# Patient Record
Sex: Male | Born: 1965 | Race: White | Hispanic: No | Marital: Married | Smoking: Never smoker
Health system: Southern US, Community
[De-identification: ages and names within clinical notes are randomized; demographics above are authoritative.]

## PROBLEM LIST (undated history)

## (undated) HISTORY — PX: SEPTOPLASTY: SUR1290

---
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
## 2010-06-24 ENCOUNTER — Ambulatory Visit: Payer: Self-pay | Admitting: Internal Medicine

## 2012-07-18 IMAGING — CR DG LUMBAR SPINE COMPLETE 4+V
1 series · 6 of 6 positions shown · non-contrast
Comparison: none

REASON FOR EXAM: mass/lipoma
COMMENTS:

PROCEDURE:     MDR - M[REDACTED] SPINE WITH OBLIQUES  - June 24, 2010 [DATE]
RESULT:     Images of the lumbar spine demonstrate the disc spaces and
vertebral body heights are maintained. There is no evidence of fracture or
bony destruction.

[Series 1: view not recorded · 0.17mm/px · 6 of 6 slices shown]
[im 1/6]
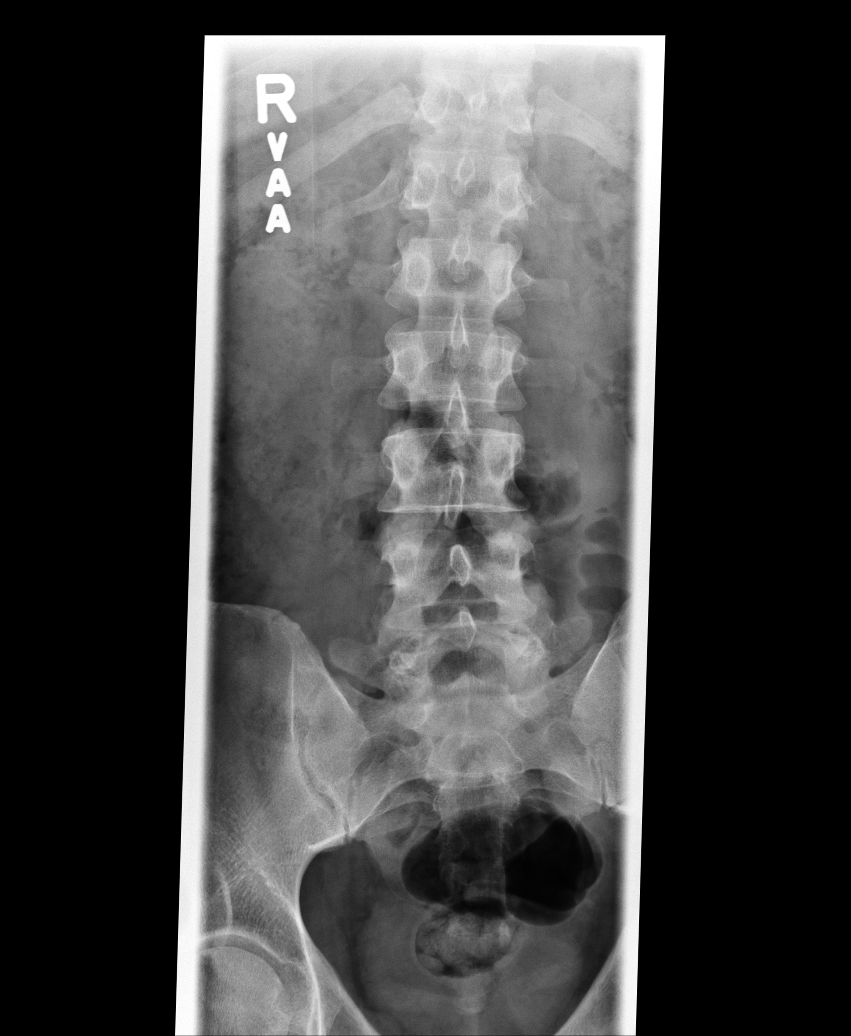
[im 2/6]
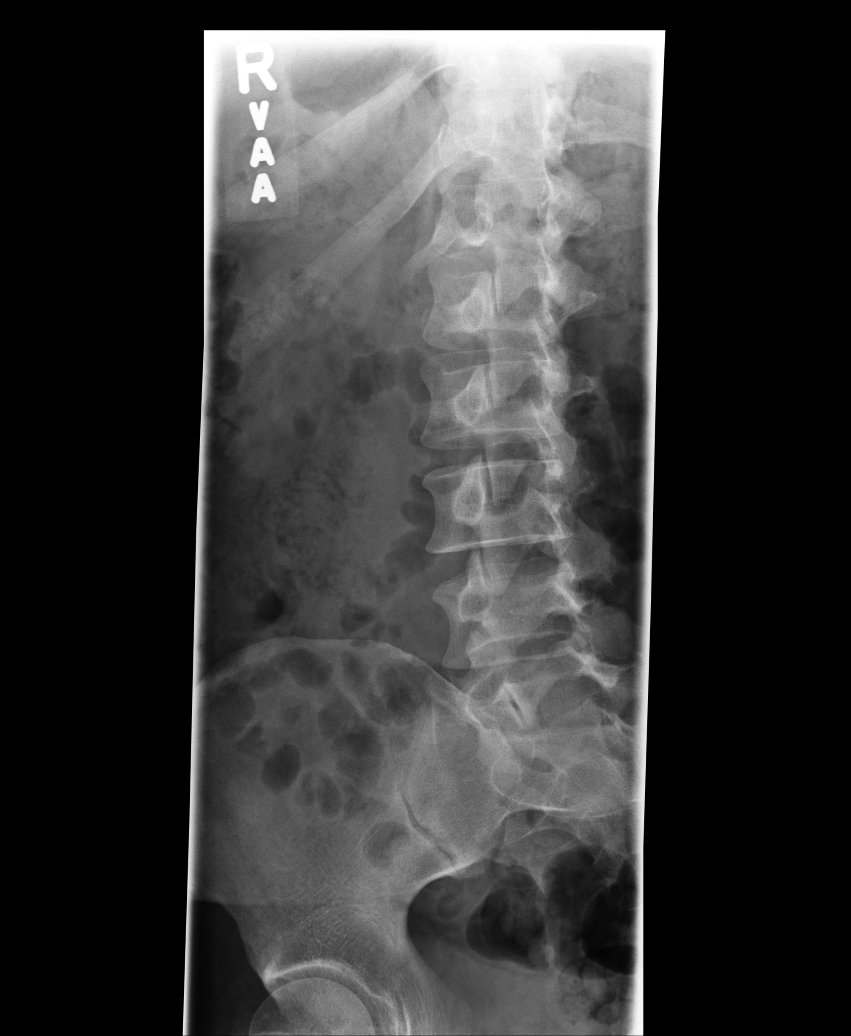
[im 3/6]
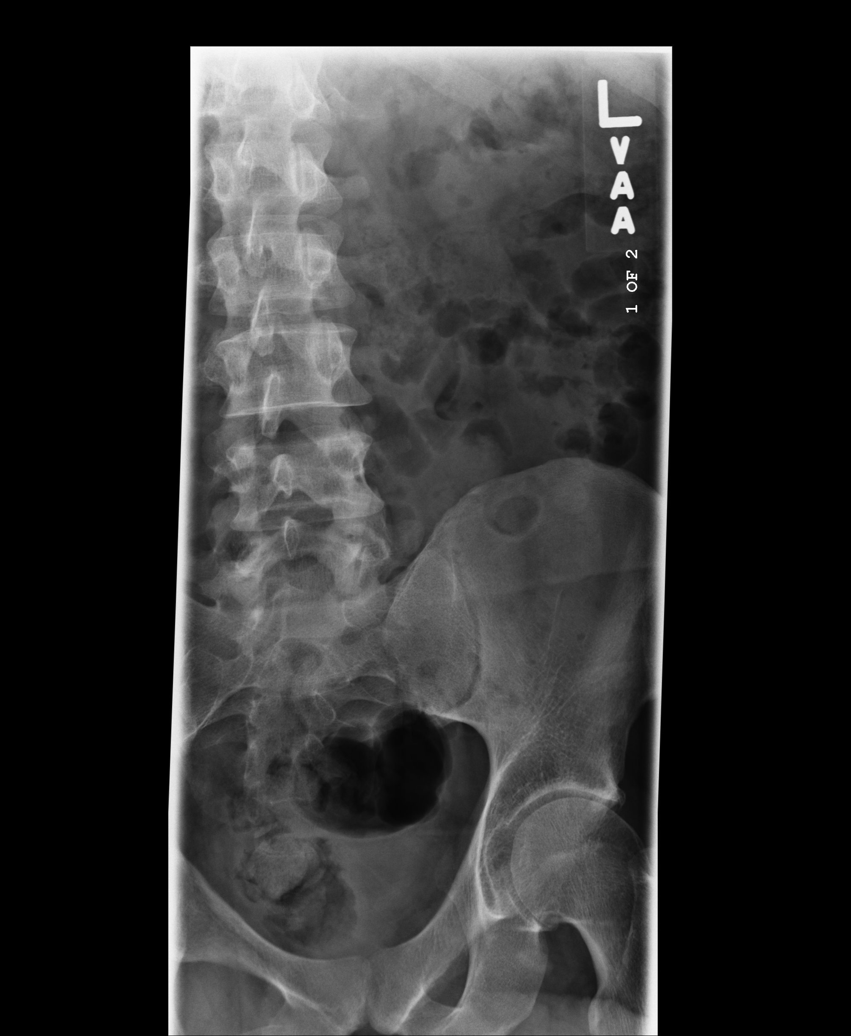
[im 4/6]
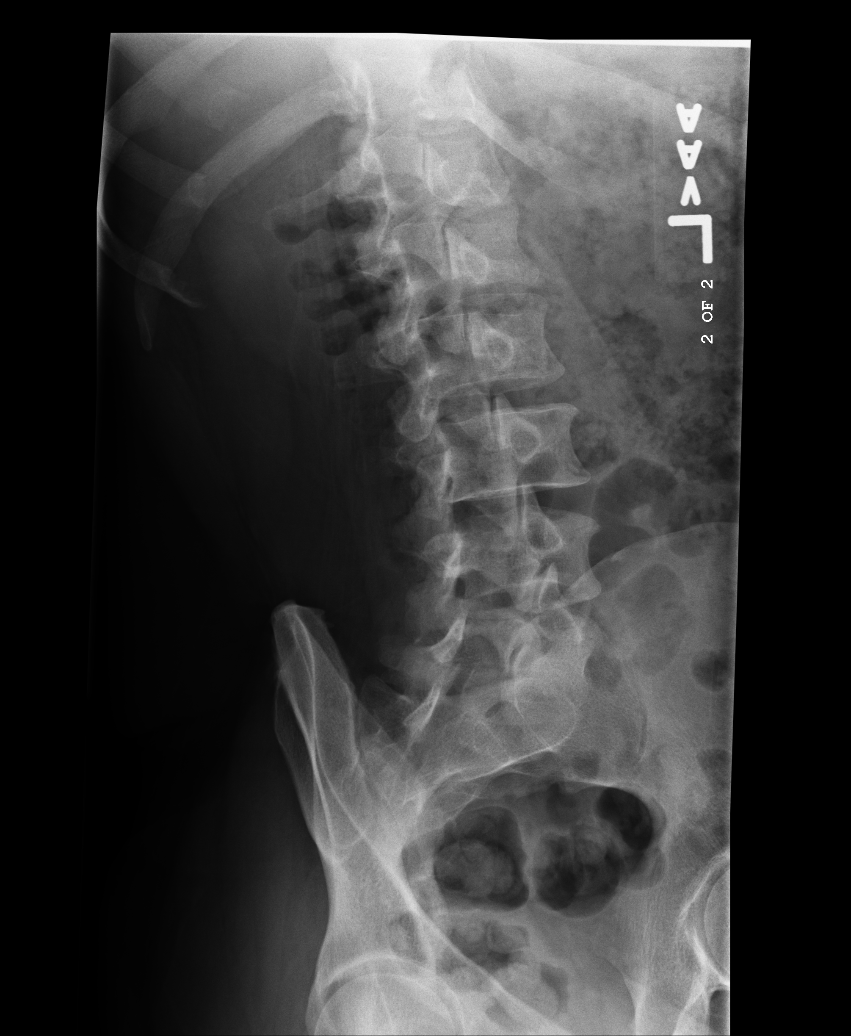
[im 5/6]
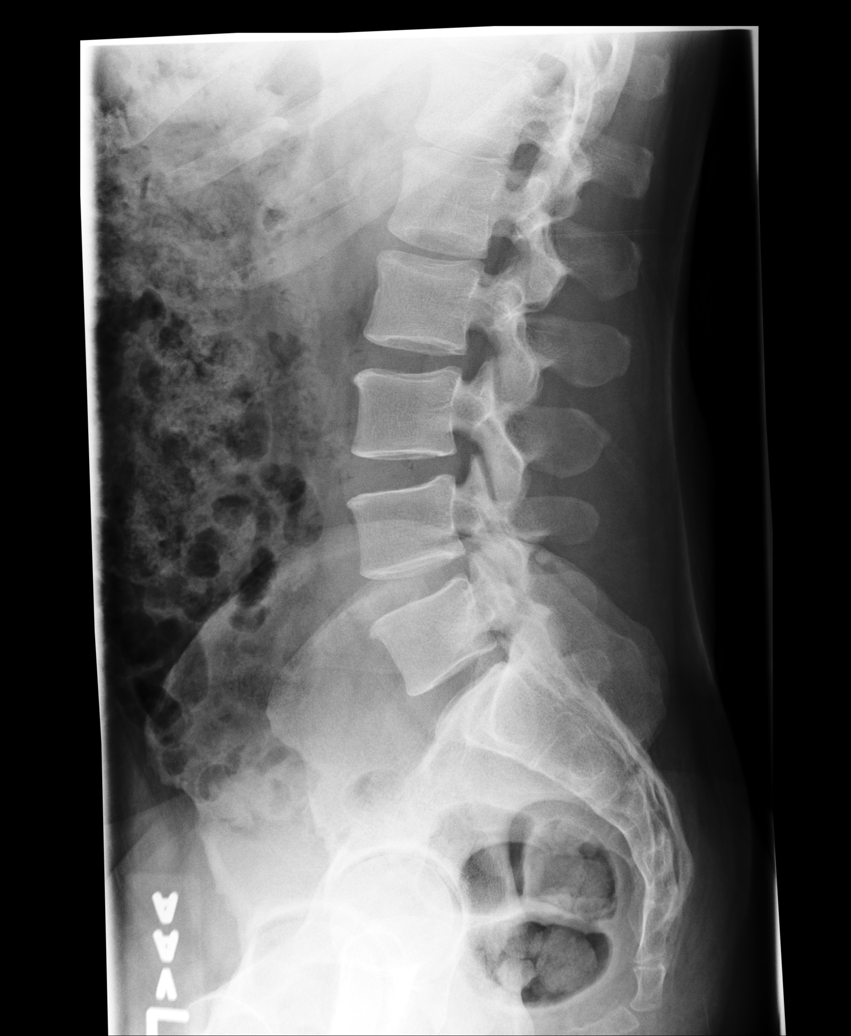
[im 6/6]
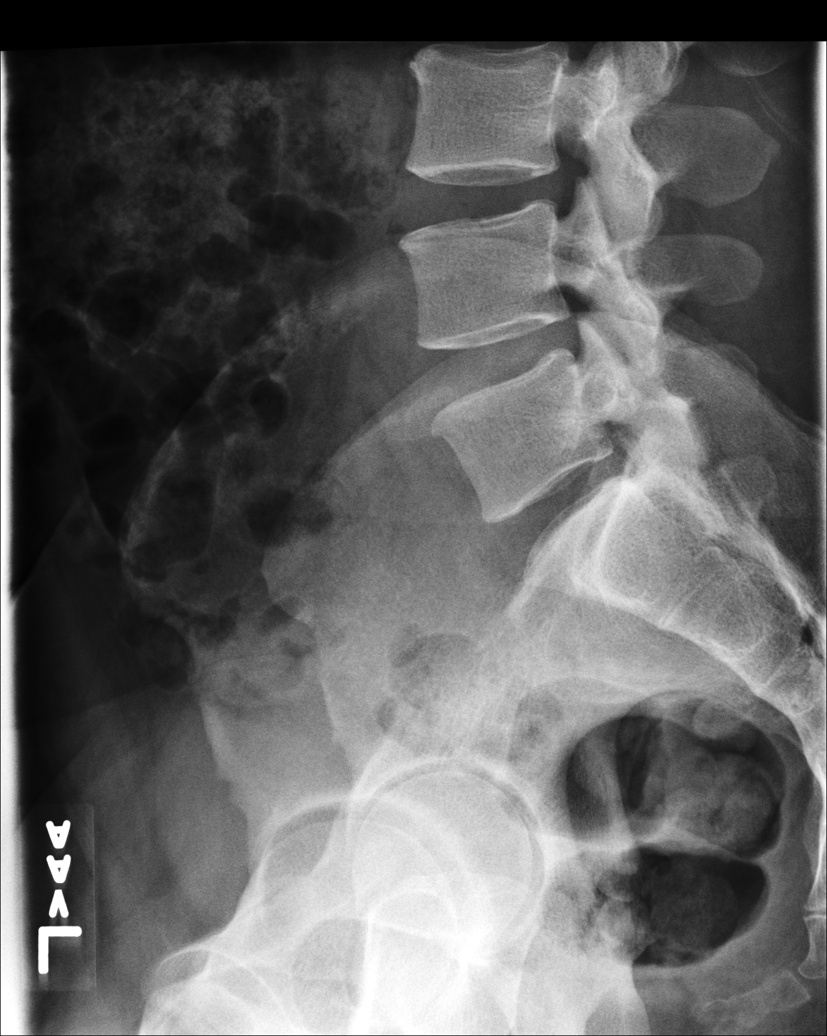

[6 of 6 positions shown; findings below may reference images not displayed]

IMPRESSION: No acute bony abnormality evident in the lumbar spine.

## 2014-06-05 LAB — CBC AND DIFFERENTIAL
HEMATOCRIT: 54 % — AB (ref 41–53)
Hemoglobin: 18.3 g/dL — AB (ref 13.5–17.5)

## 2014-06-05 LAB — LIPID PANEL
CHOLESTEROL: 267 mg/dL — AB (ref 0–200)
HDL: 92 mg/dL — AB (ref 35–70)
LDL Cholesterol: 117 mg/dL
Triglycerides: 291 mg/dL — AB (ref 40–160)

## 2014-06-05 LAB — BASIC METABOLIC PANEL
BUN: 16 mg/dL (ref 4–21)
Creatinine: 1 mg/dL (ref 0.6–1.3)

## 2014-06-05 LAB — PSA: PSA: 0.9

## 2014-06-05 LAB — TSH: TSH: 1.02 u[IU]/mL (ref 0.41–5.90)

## 2015-05-15 ENCOUNTER — Encounter: Payer: Self-pay | Admitting: Internal Medicine

## 2015-05-15 ENCOUNTER — Other Ambulatory Visit: Payer: Self-pay | Admitting: Internal Medicine

## 2015-05-15 DIAGNOSIS — M7530 Calcific tendinitis of unspecified shoulder: Secondary | ICD-10-CM

## 2015-05-15 DIAGNOSIS — G4700 Insomnia, unspecified: Secondary | ICD-10-CM | POA: Insufficient documentation

## 2015-05-15 DIAGNOSIS — I10 Essential (primary) hypertension: Secondary | ICD-10-CM | POA: Insufficient documentation

## 2015-05-15 DIAGNOSIS — E291 Testicular hypofunction: Secondary | ICD-10-CM | POA: Insufficient documentation

## 2015-05-15 DIAGNOSIS — M51379 Other intervertebral disc degeneration, lumbosacral region without mention of lumbar back pain or lower extremity pain: Secondary | ICD-10-CM | POA: Insufficient documentation

## 2015-05-15 DIAGNOSIS — M5137 Other intervertebral disc degeneration, lumbosacral region: Secondary | ICD-10-CM | POA: Insufficient documentation

## 2015-05-15 DIAGNOSIS — M7500 Adhesive capsulitis of unspecified shoulder: Secondary | ICD-10-CM | POA: Insufficient documentation

## 2015-05-15 DIAGNOSIS — D235 Other benign neoplasm of skin of trunk: Secondary | ICD-10-CM | POA: Insufficient documentation

## 2015-05-28 ENCOUNTER — Other Ambulatory Visit: Payer: Self-pay | Admitting: Internal Medicine

## 2015-05-30 NOTE — Telephone Encounter (Signed)
pts coming in on1/5/17

## 2015-06-21 ENCOUNTER — Ambulatory Visit: Payer: Self-pay | Admitting: Internal Medicine

## 2015-07-01 ENCOUNTER — Other Ambulatory Visit: Payer: Self-pay | Admitting: Internal Medicine

## 2015-07-09 NOTE — Telephone Encounter (Signed)
Unable to lm on pts vm, due to no vm setup.

## 2015-08-02 ENCOUNTER — Other Ambulatory Visit: Payer: Self-pay | Admitting: Internal Medicine

## 2015-08-09 ENCOUNTER — Encounter: Payer: Self-pay | Admitting: Internal Medicine

## 2015-08-09 ENCOUNTER — Ambulatory Visit (INDEPENDENT_AMBULATORY_CARE_PROVIDER_SITE_OTHER): Payer: BLUE CROSS/BLUE SHIELD | Admitting: Internal Medicine

## 2015-08-09 VITALS — BP 122/80 | HR 72 | Ht 68.0 in | Wt 190.4 lb

## 2015-08-09 DIAGNOSIS — J309 Allergic rhinitis, unspecified: Secondary | ICD-10-CM

## 2015-08-09 DIAGNOSIS — I10 Essential (primary) hypertension: Secondary | ICD-10-CM

## 2015-08-09 DIAGNOSIS — R42 Dizziness and giddiness: Secondary | ICD-10-CM | POA: Diagnosis not present

## 2015-08-09 MED ORDER — FEXOFENADINE HCL 180 MG PO TABS: 180.0000 mg | ORAL_TABLET | Freq: Every day | ORAL | Status: DC

## 2015-08-09 MED ORDER — AMLODIPINE BESYLATE 10 MG PO TABS: 10.0000 mg | ORAL_TABLET | Freq: Every day | ORAL | Status: DC

## 2015-08-09 MED ORDER — FLUTICASONE PROPIONATE 50 MCG/ACT NA SUSP: 2.0000 | Freq: Every day | NASAL | Status: DC

## 2015-08-09 NOTE — Progress Notes (Signed)
Date:  08/09/2015   Name:  Mason Knapp   DOB:  Aug 24, 1965   MRN:  [id number]   Chief Complaint: Follow-up and Hypertension Hypertension This is a chronic problem. The current episode started more than 1 year ago. The problem is unchanged. The problem is controlled. Pertinent negatives include no chest pain, headaches, palpitations or shortness of breath.   Allergic rhinitis - has a history of allergies but they improved for a number of years.  Recently symptoms has re-surfaced without any new exposures.  He has swollen red eyes without crusting every AM.  He has nasal congestion and runny nose.  He also has dizziness when he turns his head quickly.  He denies discolored mucus or chest complaints.   Review of Systems  Constitutional: Negative for fever, chills, diaphoresis and fatigue.  HENT: Positive for congestion, rhinorrhea, sinus pressure and sneezing. Negative for hearing loss, trouble swallowing and voice change.   Eyes: Positive for visual disturbance.  Respiratory: Negative for cough, chest tightness and shortness of breath.   Cardiovascular: Negative for chest pain, palpitations and leg swelling.  Gastrointestinal: Negative for abdominal pain.  Endocrine: Negative for polydipsia and polyuria.  Musculoskeletal: Negative for gait problem.  Neurological: Positive for dizziness. Negative for light-headedness, numbness and headaches.  Hematological: Negative for adenopathy. Does not bruise/bleed easily.    Patient Active Problem List   Diagnosis Date Noted  . 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 Insomnia, persistent 05/15/2015  . 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 Degeneration of intervertebral disc of lumbosacral region 05/15/2015  . Essential (primary) hypertension 05/15/2015  . Benign neoplasm of skin of trunk 05/15/2015    Prior to Admission medications   Medication Sig Start Date End Date Taking? Authorizing Provider  amLODipine (NORVASC) 10 MG tablet TAKE 1 TABLET  BY MOUTH EVERY DAY 07/01/15  Yes Glean Hess, MD    No Known Allergies  Past Surgical History  Procedure Laterality Date  . Collateral ligament repair, elbow  1984  . Septoplasty      Social History  Substance Use Topics  . Smoking status: Never Smoker   . Smokeless tobacco: Current User    Types: Snuff  . Alcohol Use: No     Medication list has been reviewed and updated.   Physical Exam  Constitutional: He is oriented to person, place, and time. He appears well-developed. No distress.  HENT:  Head: Normocephalic and atraumatic.  Right Ear: Tympanic membrane and ear canal normal.  Left Ear: Tympanic membrane and ear canal normal.  Nose: Nose normal. Right sinus exhibits no maxillary sinus tenderness and no frontal sinus tenderness. Left sinus exhibits no maxillary sinus tenderness and no frontal sinus tenderness.  Mouth/Throat: Oropharynx is clear and moist.  Eyes: Pupils are equal, round, and reactive to light. Right conjunctiva is injected. Left conjunctiva is injected. Right eye exhibits no nystagmus. Left eye exhibits no nystagmus.  Cardiovascular: Normal rate, regular rhythm and normal heart sounds.   Pulmonary/Chest: Effort normal and breath sounds normal. No respiratory distress.  Musculoskeletal: Normal range of motion.  Neurological: He is alert and oriented to person, place, and time.  Skin: Skin is warm and dry. No rash noted.  Psychiatric: He has a normal mood and affect. His speech is normal and behavior is normal. Thought content normal.    BP 122/80 mmHg  Pulse 72  Ht 5\' 8"  (1.727 m)  Wt 190 lb 6.4 oz (86.365 kg)  BMI 28.96 kg/m2  Assessment and Plan: 1. Essential (  primary) hypertension controlled - amLODipine (NORVASC) 10 MG tablet; Take 1 tablet (10 mg total) by mouth daily.  Dispense: 30 tablet; Refill: 5  2. Allergic rhinitis, unspecified allergic rhinitis type Begin treatment - fluticasone (FLONASE) 50 MCG/ACT nasal spray; Place 2 sprays  into both nostrils daily.  Dispense: 16 g; Refill: 6 - fexofenadine (ALLEGRA) 180 MG tablet; Take 1 tablet (180 mg total) by mouth daily.  Dispense: 30 tablet; Refill: 5  3. Vertigo Consult ENT    Halina Maidens, MD [facility]  08/09/2015

## 2016-02-02 ENCOUNTER — Other Ambulatory Visit: Payer: Self-pay | Admitting: Internal Medicine

## 2016-02-02 DIAGNOSIS — I10 Essential (primary) hypertension: Secondary | ICD-10-CM

## 2016-07-11 ENCOUNTER — Other Ambulatory Visit: Payer: Self-pay | Admitting: Internal Medicine

## 2016-07-11 DIAGNOSIS — I10 Essential (primary) hypertension: Secondary | ICD-10-CM

## 2016-07-21 NOTE — Telephone Encounter (Signed)
Unable to reach pt, keep getting a busy tone

## 2016-09-16 ENCOUNTER — Other Ambulatory Visit: Payer: Self-pay | Admitting: Internal Medicine

## 2016-09-16 DIAGNOSIS — I10 Essential (primary) hypertension: Secondary | ICD-10-CM

## 2016-09-22 ENCOUNTER — Telehealth: Payer: Self-pay

## 2016-09-22 ENCOUNTER — Other Ambulatory Visit: Payer: Self-pay | Admitting: Internal Medicine

## 2016-09-22 DIAGNOSIS — I10 Essential (primary) hypertension: Secondary | ICD-10-CM

## 2016-09-22 MED ORDER — AMLODIPINE BESYLATE 10 MG PO TABS: 10.0000 mg | ORAL_TABLET | Freq: Every day | ORAL | 0 refills | Status: DC

## 2016-09-22 NOTE — Telephone Encounter (Signed)
Wants refills for BP meds but can not get in here until 4/23 and is scheduled for that day. Can we send enough meds to last till then please? He has nothing right now. Wife left message on nurse VM. Thanks please ONLY call if you can not send in med refill.   [phone]

## 2016-09-22 NOTE — Telephone Encounter (Signed)
Sending to Dr. Army Melia for decision of medication refill...--

## 2016-09-26 ENCOUNTER — Ambulatory Visit: Payer: BLUE CROSS/BLUE SHIELD | Admitting: Internal Medicine

## 2016-10-06 ENCOUNTER — Encounter: Payer: Self-pay | Admitting: Internal Medicine

## 2016-10-06 ENCOUNTER — Ambulatory Visit (INDEPENDENT_AMBULATORY_CARE_PROVIDER_SITE_OTHER): Payer: BLUE CROSS/BLUE SHIELD | Admitting: Internal Medicine

## 2016-10-06 VITALS — BP 126/80 | HR 82 | Ht 68.0 in | Wt 187.3 lb

## 2016-10-06 DIAGNOSIS — I10 Essential (primary) hypertension: Secondary | ICD-10-CM | POA: Diagnosis not present

## 2016-10-06 DIAGNOSIS — E291 Testicular hypofunction: Secondary | ICD-10-CM | POA: Diagnosis not present

## 2016-10-06 DIAGNOSIS — J3089 Other allergic rhinitis: Secondary | ICD-10-CM | POA: Diagnosis not present

## 2016-10-06 MED ORDER — AMLODIPINE BESYLATE 10 MG PO TABS: 10.0000 mg | ORAL_TABLET | Freq: Every day | ORAL | 5 refills | Status: DC

## 2016-10-06 NOTE — Patient Instructions (Signed)
Flonase over the counter - use every day

## 2016-10-06 NOTE — Progress Notes (Signed)
Date:  10/06/2016   Name:  Mason Knapp   DOB:  09-07-1965   MRN:  [id number]   Chief Complaint: Hypertension Hypertension  This is a chronic problem. The problem is unchanged. The problem is controlled. Pertinent negatives include no chest pain, headaches, palpitations or shortness of breath. Past treatments include calcium channel blockers. The current treatment provides significant improvement.   Allergies - has watery itchy eyes and sneezing.  He has tried over the counter medications without benefit.  He has not tried [facility].  Low Testosterone - no longer taking testosterone injections (his source passed away).  It has been 6 months since he had any and he feels fine.  No change in mood, energy, or libido. He questions whether he should resume therapy.  Review of Systems  Constitutional: Negative for chills, fatigue and fever.  HENT: Negative for hearing loss, tinnitus, trouble swallowing and voice change.   Eyes: Positive for discharge, itching and visual disturbance (close vision has changed).  Respiratory: Negative for cough, chest tightness, shortness of breath and wheezing.   Cardiovascular: Negative for chest pain, palpitations and leg swelling.  Gastrointestinal: Negative for abdominal pain.  Neurological: Negative for dizziness, tremors, weakness, light-headedness and headaches.  Hematological: Negative for adenopathy.    Patient Active Problem List   Diagnosis Date Noted  . 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 Insomnia, persistent 05/15/2015  . 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 Degeneration of intervertebral disc of lumbosacral region 05/15/2015  . Essential (primary) hypertension 05/15/2015  . Benign neoplasm of skin of trunk 05/15/2015    Prior to Admission medications   Medication Sig Start Date End Date Taking? Authorizing Provider  amLODipine (NORVASC) 10 MG tablet Take 1 tablet (10 mg total) by mouth daily. 09/22/16  Yes Glean Hess, MD    No Known  Allergies  Past Surgical History:  Procedure Laterality Date  . COLLATERAL LIGAMENT REPAIR, ELBOW  1984  . SEPTOPLASTY      Social History  Substance Use Topics  . Smoking status: Never Smoker  . Smokeless tobacco: Former Systems developer    Types: Snuff    Quit date: 10/06/2008  . Alcohol use No     Medication list has been reviewed and updated.   Physical Exam  Constitutional: He is oriented to person, place, and time. He appears well-developed. No distress.  HENT:  Head: Normocephalic and atraumatic.  Right Ear: Tympanic membrane and ear canal normal.  Left Ear: Tympanic membrane and ear canal normal.  Nose: Right sinus exhibits maxillary sinus tenderness. Left sinus exhibits maxillary sinus tenderness.  Mouth/Throat: No posterior oropharyngeal edema or posterior oropharyngeal erythema.  Eyes: Conjunctivae and EOM are normal. Right eye exhibits no chemosis and no exudate. Left eye exhibits no chemosis and no exudate.  Neck: Normal range of motion. Neck supple. Carotid bruit is not present.  Cardiovascular: Normal rate, regular rhythm and normal heart sounds.   Pulmonary/Chest: Effort normal and breath sounds normal. No respiratory distress. He has no wheezes.  Musculoskeletal: Normal range of motion.  Neurological: He is alert and oriented to person, place, and time.  Skin: Skin is warm and dry. No rash noted.  Psychiatric: He has a normal mood and affect. His speech is normal and behavior is normal. Thought content normal.  Nursing note and vitals reviewed.   BP 126/80 (BP Location: Right Arm, Patient Position: Sitting, Cuff Size: Large)   Pulse 82   Ht 5\' 8"  (1.727 m)   Wt 187 lb 4.8 oz (  85 kg)   SpO2 97%   BMI 28.48 kg/m   Assessment and Plan: 1. Essential (primary) hypertension controlled - amLODipine (NORVASC) 10 MG tablet; Take 1 tablet (10 mg total) by mouth daily.  Dispense: 30 tablet; Refill: 5 - CBC with Differential/Platelet - Comprehensive metabolic panel  2.  Environmental and seasonal allergies Flonase NS  3. Eunuchoidism Recommend no further evaluation or treatment at this time   Meds ordered this encounter  Medications  . amLODipine (NORVASC) 10 MG tablet    Sig: Take 1 tablet (10 mg total) by mouth daily.    Dispense:  30 tablet    Refill:  Rosine, MD [facility]  10/06/2016

## 2017-04-28 ENCOUNTER — Other Ambulatory Visit: Payer: Self-pay | Admitting: Internal Medicine

## 2017-04-28 DIAGNOSIS — I10 Essential (primary) hypertension: Secondary | ICD-10-CM

## 2017-04-28 NOTE — Telephone Encounter (Signed)
Could not reach patient on any number provided-two of the numbers are ringing busy, the one for spouse is invalid.

## 2017-05-05 NOTE — Telephone Encounter (Signed)
Could not leave message phone rang busy

## 2017-05-27 ENCOUNTER — Other Ambulatory Visit: Payer: Self-pay | Admitting: Internal Medicine

## 2017-05-27 DIAGNOSIS — I10 Essential (primary) hypertension: Secondary | ICD-10-CM

## 2017-06-26 ENCOUNTER — Other Ambulatory Visit: Payer: Self-pay | Admitting: Internal Medicine

## 2017-06-26 DIAGNOSIS — I10 Essential (primary) hypertension: Secondary | ICD-10-CM

## 2017-07-14 NOTE — Telephone Encounter (Signed)
Could not lvm phone ringing busy.

## 2017-07-28 ENCOUNTER — Other Ambulatory Visit: Payer: Self-pay | Admitting: Internal Medicine

## 2017-07-28 DIAGNOSIS — I10 Essential (primary) hypertension: Secondary | ICD-10-CM

## 2017-08-26 ENCOUNTER — Other Ambulatory Visit: Payer: Self-pay | Admitting: Internal Medicine

## 2017-08-26 DIAGNOSIS — I10 Essential (primary) hypertension: Secondary | ICD-10-CM

## 2017-09-09 ENCOUNTER — Other Ambulatory Visit: Payer: Self-pay

## 2017-09-09 ENCOUNTER — Telehealth: Payer: Self-pay | Admitting: Internal Medicine

## 2017-09-09 DIAGNOSIS — I10 Essential (primary) hypertension: Secondary | ICD-10-CM

## 2017-09-09 MED ORDER — AMLODIPINE BESYLATE 10 MG PO TABS: ORAL_TABLET | ORAL | 0 refills | Status: DC

## 2017-09-09 NOTE — Telephone Encounter (Signed)
Sent patient 15 days worth of medication to [facility]. Thank you.

## 2017-09-09 NOTE — Telephone Encounter (Signed)
Patient requesting medfill to [facility] for Norvasc just til April 3 OV.

## 2017-09-16 ENCOUNTER — Encounter: Payer: Self-pay | Admitting: Internal Medicine

## 2017-09-16 ENCOUNTER — Ambulatory Visit (INDEPENDENT_AMBULATORY_CARE_PROVIDER_SITE_OTHER): Payer: BLUE CROSS/BLUE SHIELD | Admitting: Internal Medicine

## 2017-09-16 VITALS — BP 132/86 | HR 73 | Ht 68.0 in | Wt 191.0 lb

## 2017-09-16 DIAGNOSIS — I10 Essential (primary) hypertension: Secondary | ICD-10-CM | POA: Diagnosis not present

## 2017-09-16 DIAGNOSIS — J3089 Other allergic rhinitis: Secondary | ICD-10-CM

## 2017-09-16 DIAGNOSIS — K219 Gastro-esophageal reflux disease without esophagitis: Secondary | ICD-10-CM | POA: Insufficient documentation

## 2017-09-16 MED ORDER — DEXLANSOPRAZOLE 60 MG PO CPDR: 60.0000 mg | DELAYED_RELEASE_CAPSULE | Freq: Every day | ORAL | 2 refills | Status: DC

## 2017-09-16 MED ORDER — FEXOFENADINE HCL 180 MG PO TABS
180.0000 mg | ORAL_TABLET | Freq: Every day | ORAL | 5 refills | Status: AC
Start: 2017-09-16 — End: ?

## 2017-09-16 MED ORDER — AMLODIPINE BESYLATE 10 MG PO TABS: ORAL_TABLET | ORAL | 5 refills | Status: DC

## 2017-09-16 NOTE — Progress Notes (Signed)
Date:  09/16/2017   Name:  Mason Knapp   DOB:  01-22-1966   MRN:  [id number]   Chief Complaint: Hypertension and Gastroesophageal Reflux (Acid reflux and spitting up food in mouth. X 2 months. ) Hypertension  This is a chronic problem. The problem is controlled. Pertinent negatives include no chest pain, headaches, palpitations or shortness of breath. Past treatments include calcium channel blockers. The current treatment provides significant improvement.  Gastroesophageal Reflux  He complains of abdominal pain (gerd), choking, globus sensation, heartburn and water brash. He reports no chest pain, no coughing, no hoarse voice, no nausea or no wheezing. This is a new problem. The current episode started more than 1 month ago. The problem occurs occasionally. The problem has been gradually improving (since limiting spicy foods and taking prilosec). The heartburn duration is several minutes. The heartburn is located in the substernum. The heartburn is of moderate intensity. The heartburn wakes him from sleep. The heartburn does not limit his activity. The heartburn doesn't change with position. The symptoms are aggravated by certain foods. Pertinent negatives include no fatigue. He has tried a PPI and a histamine-2 antagonist for the symptoms. The treatment provided mild relief.  No blood work has been done since 2015.  He was last seen one year ago.  On amlodipine at that time.  Had stopped testosterone injections and doing well.  Allergic sx -  Itchy eyes and runny nose constantly.  No hives noted.  Review of Systems  Constitutional: Negative for chills, fatigue and fever.  HENT: Positive for rhinorrhea, sneezing and trouble swallowing (and intermittent dysphagia). Negative for hoarse voice.   Eyes: Positive for itching. Negative for visual disturbance.  Respiratory: Positive for choking. Negative for cough, chest tightness, shortness of breath and wheezing.   Cardiovascular: Negative for  chest pain and palpitations.  Gastrointestinal: Positive for abdominal pain (gerd) and heartburn. Negative for blood in stool, constipation, diarrhea and nausea.  Genitourinary: Negative for dysuria.  Allergic/Immunologic: Positive for environmental allergies.  Neurological: Negative for dizziness and headaches.  Hematological: Negative for adenopathy.  Psychiatric/Behavioral: Negative for sleep disturbance.    Patient Active Problem List   Diagnosis Date Noted  . Gastroesophageal reflux disease 09/16/2017  . Environmental and seasonal allergies 10/06/2016  . 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 Insomnia, persistent 05/15/2015  . 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 Degeneration of intervertebral disc of lumbosacral region 05/15/2015  . Essential (primary) hypertension 05/15/2015  . Benign neoplasm of skin of trunk 05/15/2015    Prior to Admission medications   Medication Sig Start Date End Date Taking? Authorizing Provider  amLODipine (NORVASC) 10 MG tablet TAKE 1 TABLET BY MOUTH EVERY DAY. 09/09/17   Glean Hess, MD    No Known Allergies  Past Surgical History:  Procedure Laterality Date  . COLLATERAL LIGAMENT REPAIR, ELBOW  1984  . SEPTOPLASTY      Social History   Tobacco Use  . Smoking status: Never Smoker  . Smokeless tobacco: Former Systems developer    Types: Snuff  Substance Use Topics  . Alcohol use: No    Alcohol/week: 0.0 oz  . Drug use: No     Medication list has been reviewed and updated.  PHQ 2/9 Scores 09/16/2017  PHQ - 2 Score 0  PHQ- 9 Score 0    Physical Exam  Constitutional: He is oriented to person, place, and time. He appears well-developed. No distress.  HENT:  Head: Normocephalic and atraumatic.  Neck: Normal range of motion. Neck  supple. No thyromegaly present.  Cardiovascular: Normal rate, regular rhythm and normal heart sounds.  Pulmonary/Chest: Effort normal and breath sounds normal. No respiratory distress. He has no wheezes.  Abdominal: Soft.  Bowel sounds are normal. He exhibits no mass. There is no tenderness. There is no rebound.  Musculoskeletal: Normal range of motion. He exhibits no edema or tenderness.  Neurological: He is alert and oriented to person, place, and time.  Skin: Skin is warm and dry. No rash noted.  Psychiatric: He has a normal mood and affect. His behavior is normal. Thought content normal.  Nursing note and vitals reviewed.   BP 132/86   Pulse 73   Ht 5\' 8"  (1.727 m)   Wt 191 lb (86.6 kg)   SpO2 98%   BMI 29.04 kg/m   Assessment and Plan: 1. Essential (primary) hypertension Controlled, continue current medication - CBC with Differential/Platelet - Comprehensive metabolic panel - TSH - amLODipine (NORVASC) 10 MG tablet; TAKE 1 TABLET BY MOUTH EVERY DAY.  Dispense: 30 tablet; Refill: 5  2. Gastroesophageal reflux disease, esophagitis presence not specified Change PPI to Dexilant Would like to get Ba Swallow but pt wants to make diet and med changes first rec elevated HOB 15 deg - CBC with Differential/Platelet - dexlansoprazole (DEXILANT) 60 MG capsule; Take 1 capsule (60 mg total) by mouth daily.  Dispense: 30 capsule; Refill: 2 - H. pylori antibody, IgG  3. Environmental and seasonal allergies Allegra 180 mg daily PRN - fexofenadine (ALLEGRA ALLERGY) 180 MG tablet; Take 1 tablet (180 mg total) by mouth daily.  Dispense: 30 tablet; Refill: 5   Meds ordered this encounter  Medications  . amLODipine (NORVASC) 10 MG tablet    Sig: TAKE 1 TABLET BY MOUTH EVERY DAY.    Dispense:  30 tablet    Refill:  5    Need appt  . dexlansoprazole (DEXILANT) 60 MG capsule    Sig: Take 1 capsule (60 mg total) by mouth daily.    Dispense:  30 capsule    Refill:  2  . fexofenadine (ALLEGRA ALLERGY) 180 MG tablet    Sig: Take 1 tablet (180 mg total) by mouth daily.    Dispense:  30 tablet    Refill:  5    Partially dictated using Editor, commissioning. Any errors are unintentional.  Halina Maidens,  MD [facility]  09/16/2017

## 2017-09-16 NOTE — Patient Instructions (Signed)
Allergies - Allegra 180 mg once a day or Claritin 10 mg once a day

## 2017-09-17 LAB — COMPREHENSIVE METABOLIC PANEL
A/G RATIO: 1.9 (ref 1.2–2.2)
ALK PHOS: 101 IU/L (ref 39–117)
ALT: 42 IU/L (ref 0–44)
AST: 32 IU/L (ref 0–40)
Albumin: 4.9 g/dL (ref 3.5–5.5)
BUN/Creatinine Ratio: 16 (ref 9–20)
BUN: 15 mg/dL (ref 6–24)
Bilirubin Total: 0.6 mg/dL (ref 0.0–1.2)
CO2: 23 mmol/L (ref 20–29)
CREATININE: 0.93 mg/dL (ref 0.76–1.27)
Calcium: 10 mg/dL (ref 8.7–10.2)
Chloride: 98 mmol/L (ref 96–106)
GFR calc Af Amer: 111 mL/min/{1.73_m2} (ref >59)
GFR calc non Af Amer: 96 mL/min/{1.73_m2} (ref >59)
GLOBULIN, TOTAL: 2.6 g/dL (ref 1.5–4.5)
Glucose: 93 mg/dL (ref 65–99)
POTASSIUM: 4 mmol/L (ref 3.5–5.2)
SODIUM: 138 mmol/L (ref 134–144)
Total Protein: 7.5 g/dL (ref 6.0–8.5)

## 2017-09-17 LAB — TSH: TSH: 0.768 u[IU]/mL (ref 0.450–4.500)

## 2017-09-17 LAB — H. PYLORI ANTIBODY, IGG: H. pylori, IgG AbS: <0.8 Index Value (ref 0.00–0.79)

## 2017-09-17 LAB — CBC WITH DIFFERENTIAL/PLATELET
Basophils Absolute: 0 10*3/uL (ref 0.0–0.2)
Basos: 1 %
EOS (ABSOLUTE): 0.2 10*3/uL (ref 0.0–0.4)
EOS: 5 %
HEMATOCRIT: 48.1 % (ref 37.5–51.0)
Hemoglobin: 16.5 g/dL (ref 13.0–17.7)
Immature Grans (Abs): 0 10*3/uL (ref 0.0–0.1)
Immature Granulocytes: 1 %
LYMPHS ABS: 1 10*3/uL (ref 0.7–3.1)
Lymphs: 27 %
MCH: 32.9 pg (ref 26.6–33.0)
MCHC: 34.3 g/dL (ref 31.5–35.7)
MCV: 96 fL (ref 79–97)
Monocytes Absolute: 0.4 10*3/uL (ref 0.1–0.9)
Monocytes: 11 %
NEUTROS PCT: 55 %
Neutrophils Absolute: 2.1 10*3/uL (ref 1.4–7.0)
PLATELETS: 247 10*3/uL (ref 150–379)
RBC: 5.02 x10E6/uL (ref 4.14–5.80)
RDW: 13.6 % (ref 12.3–15.4)
WBC: 3.7 10*3/uL (ref 3.4–10.8)

## 2018-02-10 ENCOUNTER — Ambulatory Visit: Payer: BLUE CROSS/BLUE SHIELD | Admitting: Internal Medicine

## 2018-03-27 ENCOUNTER — Other Ambulatory Visit: Payer: Self-pay | Admitting: Internal Medicine

## 2018-03-27 DIAGNOSIS — I10 Essential (primary) hypertension: Secondary | ICD-10-CM

## 2018-04-05 ENCOUNTER — Other Ambulatory Visit: Payer: Self-pay | Admitting: Internal Medicine

## 2018-04-05 DIAGNOSIS — I10 Essential (primary) hypertension: Secondary | ICD-10-CM

## 2018-05-18 ENCOUNTER — Encounter: Payer: BLUE CROSS/BLUE SHIELD | Admitting: Internal Medicine

## 2018-08-03 ENCOUNTER — Other Ambulatory Visit: Payer: Self-pay | Admitting: Internal Medicine

## 2018-08-03 DIAGNOSIS — I10 Essential (primary) hypertension: Secondary | ICD-10-CM

## 2018-11-17 ENCOUNTER — Encounter: Payer: BLUE CROSS/BLUE SHIELD | Admitting: Internal Medicine

## 2019-02-06 ENCOUNTER — Other Ambulatory Visit: Payer: Self-pay | Admitting: Internal Medicine

## 2019-02-06 DIAGNOSIS — I10 Essential (primary) hypertension: Secondary | ICD-10-CM

## 2019-02-07 NOTE — Telephone Encounter (Signed)
Patient mentioned that hes not sure when he can come in for med refills, I did mention in order for him to continue getting refills he must come in for an appointment. He said hes not taking the pills and said that he will call back to schedule appointment.

## 2019-05-19 ENCOUNTER — Other Ambulatory Visit: Payer: Self-pay | Admitting: Internal Medicine

## 2019-05-19 DIAGNOSIS — I10 Essential (primary) hypertension: Secondary | ICD-10-CM

## 2019-05-20 NOTE — Telephone Encounter (Signed)
Wife called to ask if we can have his meds transferred to [facility], I mention to her she would have to call them to transfer the meds. She also asked if it would be ok if Mason Knapp was too miss a day or two of is meds I told her that we could not give that advise to her, and that he should not miss any dose which could cause him to have a stroke or any kind of reaction to not taking his meds. I advise him to go to urgent care for a few days of medicine  if he is completley out.

## 2019-05-20 NOTE — Telephone Encounter (Signed)
Please call the patient and inform him he needs an appt to be seen in the next 90 days before we can continue to RF his meds. Thank you.

## 2019-09-01 ENCOUNTER — Encounter: Payer: BLUE CROSS/BLUE SHIELD | Admitting: Internal Medicine

## 2023-10-14 ENCOUNTER — Encounter: Payer: Self-pay | Admitting: Student

## 2023-10-14 ENCOUNTER — Ambulatory Visit (INDEPENDENT_AMBULATORY_CARE_PROVIDER_SITE_OTHER): Payer: Self-pay | Admitting: Student

## 2023-10-14 VITALS — BP 124/80 | HR 78 | Ht 68.0 in | Wt 155.8 lb

## 2023-10-14 DIAGNOSIS — I10 Essential (primary) hypertension: Secondary | ICD-10-CM

## 2023-10-14 DIAGNOSIS — M25562 Pain in left knee: Secondary | ICD-10-CM

## 2023-10-14 DIAGNOSIS — G2581 Restless legs syndrome: Secondary | ICD-10-CM | POA: Insufficient documentation

## 2023-10-14 DIAGNOSIS — G8929 Other chronic pain: Secondary | ICD-10-CM | POA: Insufficient documentation

## 2023-10-14 DIAGNOSIS — E785 Hyperlipidemia, unspecified: Secondary | ICD-10-CM | POA: Insufficient documentation

## 2023-10-14 DIAGNOSIS — E782 Mixed hyperlipidemia: Secondary | ICD-10-CM
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
## 2023-10-14 NOTE — Assessment & Plan Note (Signed)
 Elevated cholesterol and triglyceride in the past. Lipid panel today.

## 2023-10-14 NOTE — Assessment & Plan Note (Signed)
 Previously on amlodipine , has not been on this in years. Has lost weight exercises regularly now. BP is well controlled. Continue to monitor.
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
## 2023-10-15 ENCOUNTER — Encounter: Payer: Self-pay | Admitting: Family Medicine

## 2023-10-15 ENCOUNTER — Ambulatory Visit (INDEPENDENT_AMBULATORY_CARE_PROVIDER_SITE_OTHER): Payer: Self-pay | Admitting: Family Medicine

## 2023-10-15 VITALS — BP 124/76 | HR 66 | Ht 68.0 in | Wt 157.0 lb

## 2023-10-15 DIAGNOSIS — M25562 Pain in left knee: Secondary | ICD-10-CM

## 2023-10-15 DIAGNOSIS — G8929 Other chronic pain: Secondary | ICD-10-CM

## 2023-10-15 LAB — BASIC METABOLIC PANEL WITH GFR
BUN/Creatinine Ratio: 22 — ABNORMAL HIGH (ref 9–20)
BUN: 24 mg/dL (ref 6–24)
CO2: 22 mmol/L (ref 20–29)
Calcium: 9.5 mg/dL (ref 8.7–10.2)
Chloride: 104 mmol/L (ref 96–106)
Creatinine, Ser: 1.08 mg/dL (ref 0.76–1.27)
Glucose: 78 mg/dL (ref 70–99)
Potassium: 4.5 mmol/L (ref 3.5–5.2)
Sodium: 142 mmol/L (ref 134–144)
eGFR: 81 mL/min/{1.73_m2} (ref >59)

## 2023-10-15 LAB — CBC WITH DIFFERENTIAL/PLATELET
Basophils Absolute: 0 10*3/uL (ref 0.0–0.2)
Basos: 1 %
EOS (ABSOLUTE): 0.1 10*3/uL (ref 0.0–0.4)
Eos: 1 %
Hematocrit: 52.9 % — ABNORMAL HIGH (ref 37.5–51.0)
Hemoglobin: 16.6 g/dL (ref 13.0–17.7)
Immature Grans (Abs): 0 10*3/uL (ref 0.0–0.1)
Immature Granulocytes: 0 %
Lymphocytes Absolute: 1.1 10*3/uL (ref 0.7–3.1)
Lymphs: 21 %
MCH: 30.1 pg (ref 26.6–33.0)
MCHC: 31.4 g/dL — ABNORMAL LOW (ref 31.5–35.7)
MCV: 96 fL (ref 79–97)
Monocytes Absolute: 0.4 10*3/uL (ref 0.1–0.9)
Monocytes: 7 %
Neutrophils Absolute: 3.5 10*3/uL (ref 1.4–7.0)
Neutrophils: 70 %
Platelets: 235 10*3/uL (ref 150–450)
RBC: 5.52 x10E6/uL (ref 4.14–5.80)
RDW: 13 % (ref 11.6–15.4)
WBC: 5.1 10*3/uL (ref 3.4–10.8)

## 2023-10-15 LAB — LIPID PANEL
Chol/HDL Ratio: 3.9 ratio (ref 0.0–5.0)
Cholesterol, Total: 185 mg/dL (ref 100–199)
HDL: 47 mg/dL (ref >39)
LDL Chol Calc (NIH): 126 mg/dL — ABNORMAL HIGH (ref 0–99)
Triglycerides: 61 mg/dL (ref 0–149)
VLDL Cholesterol Cal: 12 mg/dL (ref 5–40)

## 2023-10-15 MED ORDER — CELECOXIB 100 MG PO CAPS: 100.0000 mg | ORAL_CAPSULE | Freq: Two times a day (BID) | ORAL | 0 refills | Status: DC | PRN

## 2023-10-15 NOTE — Progress Notes (Signed)
 Primary Care / Sports Medicine Office Visit  Patient Information:  Patient ID: Mason Knapp, male DOB: [birth year] Age: 58 y.o. MRN: [id number]   Mason Knapp is a pleasant 59 y.o. male presenting with the following:  Chief Complaint  Patient presents with   Knee Pain    Chronic Left knee pain. The pain is worse at night and in the morning. He takes [facility] powder daily for pain. He has tried creams and cortisone injections but they do not help anymore.     Vitals:   10/15/23 1048  BP: 124/76  Pulse: 66  SpO2: 97%   Vitals:   10/15/23 1048  Weight: 157 lb (71.2 kg)  Height: 5\' 8"  (1.727 m)   Body mass index is 23.87 kg/m.  No results found.   Independent interpretation of notes and tests performed by another provider:   None  Procedures performed:   Procedure:  Injection of left knee.  Anteromedial approach. Verbal informed consent obtained and verified. Skin prepped in a sterile fashion. Ethyl chloride for topical local analgesia.  Completed without difficulty and tolerated well. Medication: triamcinolone acetonide 40 mg/mL suspension for injection 1 mL total and 2 mL lidocaine 1% without epinephrine utilized for needle placement anesthetic Advised to contact for fevers/chills, erythema, induration, drainage, or persistent bleeding.   Pertinent History, Exam, Impression, and Recommendations:   Problem List Items Addressed This Visit     Chronic pain of left knee - Primary   History of Present Illness Mason Knapp is a 58 year old male who presents with chronic left knee pain.  He experiences chronic left knee pain, which has worsened over the past year or two. The pain is most severe at rest, particularly at night and in the early morning, and is described as a 'heartbeat' sensation. Despite the pain, he remains physically active, walking five miles and performing 500 sit-ups and push-ups daily.  He has a history of knee injury and has previously  received cortisone injections, which initially provided relief for six months to a year, but the last injection lasted only about a month. He has not had an injection in several years due to cost and lack of insurance. He has tried ibuprofen 800 mg and topical creams without relief and takes five to ten BC powders a day for pain relief.  He seeks to manage his pain without narcotics or frequent injections. He is in the process of obtaining insurance after retiring. He experiences no pain during physical activity but significant pain at rest, particularly at night and in the morning.  Physical Exam MEASUREMENTS: Weight- 157. INSPECTION: Good quadriceps tone. PALPATION: No effusion in the left knee. Mild tenderness at the lateral patella facet. Nontender patellar and quadriceps tendons, nontender lateral medial joint lines. RANGE OF MOTION: Painless terminal flexion, ROM 0-135 degrees. No crepitus with passive ROM. SPECIAL TESTS: Negative anterior/posterior drawer, valgus/varus stress, McMurray's tests. Single leg squat elicits increased valgus motion on the left.  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 Previous treatments ineffective. Cortisone injection considered for immediate relief. - Administer cortisone injection to the left knee. - Prescribe oral anti-inflammatory medication. - Provide exercises to improve knee alignment. - Advise ice application for 20 minutes twice daily post-injection. - Instruct to avoid strenuous activity for two days post-injection.  Osteoarthritis of the knee Suspected osteoarthritis in the patellofemoral compartment, exacerbated by activity and weight changes. Gel injections considered  for long-term relief once insured. - Consider gel injections once insurance is obtained. - Advise weight management and continued physical activity.      Relevant Medications   celecoxib   (CELEBREX ) 100 MG capsule     Orders & Medications Medications:  Meds ordered this encounter  Medications   celecoxib  (CELEBREX ) 100 MG capsule    Sig: Take 1 capsule (100 mg total) by mouth 2 (two) times daily as needed.    Dispense:  60 capsule    Refill:  0   No orders of the defined types were placed in this encounter.    Return if symptoms worsen or fail to improve.     Ma Saupe, MD, [facility]   Primary Care Sports Medicine Primary Care and Sports Medicine at [facility]

## 2023-10-15 NOTE — Patient Instructions (Signed)
 Patient Plan  Knee Pain Due to Patellofemoral Maltracking: 1. Receive a cortisone injection in the left knee today. 2. Start taking prescribed oral anti-inflammatory medication (Celebrex  twice a day with food), continue until knee symptoms respond to cortisone. 3. Perform exercises provided to improve knee alignment once knee symptoms resolve. 4. Apply ice to the knee for 20 minutes, twice daily, after the injection. 5. Avoid strenuous activities for two days following the injection.  Osteoarthritis of the Knee: 1. Consider gel injections for long-term relief once insurance coverage is confirmed. 2. Focus on weight management and maintain regular physical activity.  Red Flags: - Contact your healthcare provider if you notice increased swelling, redness, or persistent pain in the knee.

## 2023-10-15 NOTE — Progress Notes (Signed)
 Spoke with patient, discussed lab results and instructions by provider. Patient verbalized understanding.
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
## 2023-10-22 ENCOUNTER — Telehealth: Payer: Self-pay

## 2023-10-22 NOTE — Telephone Encounter (Signed)
 Copied from CRM [id number]. Topic: General - Other >> Oct 22, 2023 11:22 AM Mason Knapp wrote: Reason for CRM: Patient's wife is calling in because patient had an injection in his knee and was told by Dr. Augustus Ledger if it didn't work to call him back so they could discuss next steps. Patient says he would like for Dr. Augustus Ledger to call him so they can discuss next steps.

## 2023-11-05 ENCOUNTER — Telehealth: Payer: Self-pay | Admitting: Student

## 2023-11-05 ENCOUNTER — Other Ambulatory Visit: Payer: Self-pay | Admitting: Family Medicine

## 2023-11-05 DIAGNOSIS — G8929 Other chronic pain: Secondary | ICD-10-CM

## 2023-11-05 NOTE — Telephone Encounter (Signed)
 Please call patient in regards to next step in treatment for his knee. Knee injection only lasted 4 days.  JM

## 2023-11-05 NOTE — Telephone Encounter (Signed)
 Copied from CRM [id number]. Topic: General - Call Back - No Documentation >> Nov 05, 2023  1:17 PM Alpha [employer] wrote: Reason for CRM: Patient is requesting a call back from Dr. Rebekah Canada to discuss his knee injection.  Callback [phone]

## 2023-11-06 NOTE — Telephone Encounter (Signed)
 Tried to call patient unable to leave VM.   JM

## 2023-11-11 ENCOUNTER — Other Ambulatory Visit: Payer: Self-pay | Admitting: Family Medicine

## 2023-11-11 ENCOUNTER — Telehealth: Payer: Self-pay

## 2023-11-11 DIAGNOSIS — G8929 Other chronic pain: Secondary | ICD-10-CM

## 2023-11-11 NOTE — Telephone Encounter (Signed)
 LMOM for patient to call back.  JM  Copied from CRM [id number]. Topic: General - Other >> Nov 11, 2023  9:05 AM Baldomero Bone wrote: Reason for CRM: Patient is calling Annice Jolly back from previous conversation patient had with her. Callback number is [phone]

## 2023-11-11 NOTE — Telephone Encounter (Signed)
 Spoke with patient and he stated he does not have any insurance. He needs to know what the xray is going to cost. Or he will take a referral to pain management.   Mason Knapp

## 2023-11-12 NOTE — Telephone Encounter (Signed)
 I have called and left VM for patient.  JM

## 2023-11-13 NOTE — Telephone Encounter (Signed)
 Requested medications are due for refill today.  yes  Requested medications are on the active medications list.  yes  Last refill. 10/15/2023 #60 0 rf  Future visit scheduled.   no  Notes to clinic.  Expired ALT and AST. Rx written to expire 11/14/2023.    Requested Prescriptions  Pending Prescriptions Disp Refills   celecoxib  (CELEBREX ) 100 MG capsule [Pharmacy Med Name: CELECOXIB  100 MG CAPSULE] 60 capsule 0    Sig: TAKE 1 CAPSULE BY MOUTH 2 TIMES DAILY AS NEEDED.     Analgesics:  COX2 Inhibitors Failed - 11/13/2023 10:54 AM      Failed - Manual Review: Labs are only required if the patient has taken medication for more than 8 weeks.      Failed - HCT in normal range and within 360 days    Hematocrit  Date Value Ref Range Status  10/14/2023 52.9 (H) 37.5 - 51.0 % Final         Failed - AST in normal range and within 360 days    AST  Date Value Ref Range Status  09/16/2017 32 0 - 40 IU/L Final         Failed - ALT in normal range and within 360 days    ALT  Date Value Ref Range Status  09/16/2017 42 0 - 44 IU/L Final         Passed - HGB in normal range and within 360 days    Hemoglobin  Date Value Ref Range Status  10/14/2023 16.6 13.0 - 17.7 g/dL Final         Passed - Cr in normal range and within 360 days    Creatinine, Ser  Date Value Ref Range Status  10/14/2023 1.08 0.76 - 1.27 mg/dL Final         Passed - eGFR is 30 or above and within 360 days    GFR calc Af Amer  Date Value Ref Range Status  09/16/2017 111 >59 mL/min/1.73 Final   GFR calc non Af Amer  Date Value Ref Range Status  09/16/2017 96 >59 mL/min/1.73 Final   eGFR  Date Value Ref Range Status  10/14/2023 81 >59 mL/min/1.73 Final         Passed - Patient is not pregnant      Passed - Valid encounter within last 12 months    Recent Outpatient Visits           4 weeks ago Chronic pain of left knee   [place] [facility], Dessie Flow, MD    1 month ago Restless legs   [facility] & [facility] Medicine at [facility], MD

## 2023-12-10 ENCOUNTER — Ambulatory Visit
Admission: RE | Admit: 2023-12-10 | Discharge: 2023-12-10 | Disposition: A | Discharge status: Home / Self Care | Payer: Self-pay | Attending: Family Medicine | Admitting: Family Medicine

## 2023-12-10 ENCOUNTER — Ambulatory Visit
Admission: RE | Admit: 2023-12-10 | Discharge: 2023-12-10 | Disposition: A | Discharge status: Home / Self Care | Payer: Self-pay | Source: Ambulatory Visit | Attending: Family Medicine | Admitting: Family Medicine

## 2023-12-10 DIAGNOSIS — G8929 Other chronic pain: Secondary | ICD-10-CM

## 2023-12-13 ENCOUNTER — Ambulatory Visit: Payer: Self-pay | Admitting: Family Medicine

## 2023-12-13 NOTE — Progress Notes (Signed)
 Please let the patient know that while the X-rays didn't show any major issues, there is a bone spur at the top of the kneecap, which matches our exam findings.  Treatment options include medications, PT, bracing, or further imaging with MRI. We can refer to orthopedics if desired, but surgery is unlikely to be discussed unless PT fails or MRI shows more.  Let me know how he'd like to proceed.

## 2023-12-14 ENCOUNTER — Other Ambulatory Visit: Payer: Self-pay | Admitting: Family Medicine

## 2023-12-14 MED ORDER — GABAPENTIN 300 MG PO CAPS: 300.0000 mg | ORAL_CAPSULE | Freq: Every day | ORAL | 0 refills | Status: DC

## 2023-12-14 NOTE — Progress Notes (Signed)
 Spoke with patient. Verbalized understanding. He stated he is willing to try medication and orthopedics. He is uninsured and does not want to do the MRI.

## 2023-12-23 ENCOUNTER — Other Ambulatory Visit: Payer: Self-pay | Admitting: Family Medicine

## 2023-12-23 DIAGNOSIS — G8929 Other chronic pain: Secondary | ICD-10-CM

## 2023-12-24 NOTE — Telephone Encounter (Signed)
 Requested medications are due for refill today.  yes  Requested medications are on the active medications list.  yes  Last refill. 11/13/2023 #60 0 rf  Future visit scheduled.   yes  Notes to clinic.  Expired labs.    Requested Prescriptions  Pending Prescriptions Disp Refills   celecoxib  (CELEBREX ) 100 MG capsule [Pharmacy Med Name: CELECOXIB  100 MG CAPSULE] 60 capsule 0    Sig: TAKE 1 CAPSULE BY MOUTH 2 TIMES DAILY AS NEEDED.     Analgesics:  COX2 Inhibitors Failed - 12/24/2023  5:34 PM      Failed - Manual Review: Labs are only required if the patient has taken medication for more than 8 weeks.      Failed - HCT in normal range and within 360 days    Hematocrit  Date Value Ref Range Status  10/14/2023 52.9 (H) 37.5 - 51.0 % Final         Failed - AST in normal range and within 360 days    AST  Date Value Ref Range Status  09/16/2017 32 0 - 40 IU/L Final         Failed - ALT in normal range and within 360 days    ALT  Date Value Ref Range Status  09/16/2017 42 0 - 44 IU/L Final         Passed - HGB in normal range and within 360 days    Hemoglobin  Date Value Ref Range Status  10/14/2023 16.6 13.0 - 17.7 g/dL Final         Passed - Cr in normal range and within 360 days    Creatinine, Ser  Date Value Ref Range Status  10/14/2023 1.08 0.76 - 1.27 mg/dL Final         Passed - eGFR is 30 or above and within 360 days    GFR calc Af Amer  Date Value Ref Range Status  09/16/2017 111 >59 mL/min/1.73 Final   GFR calc non Af Amer  Date Value Ref Range Status  09/16/2017 96 >59 mL/min/1.73 Final   eGFR  Date Value Ref Range Status  10/14/2023 81 >59 mL/min/1.73 Final         Passed - Patient is not pregnant      Passed - Valid encounter within last 12 months    Recent Outpatient Visits           2 months ago Chronic pain of left knee   [place] [facility], Selinda PARAS, MD   2 months ago Restless legs    [facility] Primary Care & Sports Medicine at [facility], MD

## 2023-12-25 NOTE — Telephone Encounter (Signed)
 Called and left VM for pt to call back and discuss.

## 2024-01-12 ENCOUNTER — Other Ambulatory Visit: Payer: Self-pay | Admitting: Family Medicine

## 2024-01-13 NOTE — Telephone Encounter (Signed)
 Requested Prescriptions  Pending Prescriptions Disp Refills   gabapentin  (NEURONTIN ) 300 MG capsule [Pharmacy Med Name: GABAPENTIN  300 MG CAPSULE] 90 capsule 0    Sig: TAKE 1 CAPSULE BY MOUTH EVERYDAY AT BEDTIME     Neurology: Anticonvulsants - gabapentin  Passed - 01/13/2024  9:31 AM      Passed - Cr in normal range and within 360 days    Creatinine, Ser  Date Value Ref Range Status  10/14/2023 1.08 0.76 - 1.27 mg/dL Final         Passed - Completed PHQ-2 or PHQ-9 in the last 360 days      Passed - Valid encounter within last 12 months    Recent Outpatient Visits           3 months ago Chronic pain of left knee   [facility], Selinda PARAS, MD   3 months ago Restless legs   [facility] Care & Sports Medicine at [facility]

## 2024-01-21 ENCOUNTER — Encounter: Payer: Self-pay | Admitting: Student

## 2024-03-01 ENCOUNTER — Other Ambulatory Visit: Payer: Self-pay | Admitting: Family Medicine

## 2024-03-01 DIAGNOSIS — G8929 Other chronic pain: Secondary | ICD-10-CM

## 2024-03-02 NOTE — Telephone Encounter (Signed)
 Requested Prescriptions  Pending Prescriptions Disp Refills   celecoxib  (CELEBREX ) 100 MG capsule [Pharmacy Med Name: CELECOXIB  100 MG CAPSULE] 180 capsule 0    Sig: TAKE 1 CAPSULE BY MOUTH 2 TIMES DAILY AS NEEDED.     Analgesics:  COX2 Inhibitors Failed - 03/02/2024 12:00 PM      Failed - Manual Review: Labs are only required if the patient has taken medication for more than 8 weeks.      Failed - HCT in normal range and within 360 days    Hematocrit  Date Value Ref Range Status  10/14/2023 52.9 (H) 37.5 - 51.0 % Final         Failed - AST in normal range and within 360 days    AST  Date Value Ref Range Status  09/16/2017 32 0 - 40 IU/L Final         Failed - ALT in normal range and within 360 days    ALT  Date Value Ref Range Status  09/16/2017 42 0 - 44 IU/L Final         Passed - HGB in normal range and within 360 days    Hemoglobin  Date Value Ref Range Status  10/14/2023 16.6 13.0 - 17.7 g/dL Final         Passed - Cr in normal range and within 360 days    Creatinine, Ser  Date Value Ref Range Status  10/14/2023 1.08 0.76 - 1.27 mg/dL Final         Passed - eGFR is 30 or above and within 360 days    GFR calc Af Amer  Date Value Ref Range Status  09/16/2017 111 >59 mL/min/1.73 Final   GFR calc non Af Amer  Date Value Ref Range Status  09/16/2017 96 >59 mL/min/1.73 Final   eGFR  Date Value Ref Range Status  10/14/2023 81 >59 mL/min/1.73 Final         Passed - Patient is not pregnant      Passed - Valid encounter within last 12 months    Recent Outpatient Visits           4 months ago Chronic pain of left knee   [facility], Selinda PARAS, MD   4 months ago Restless legs   [facility], [place]
